# Patient Record
Sex: Female | Born: 1970 | Race: White | Hispanic: No | Marital: Married | Smoking: Never smoker
Health system: Southern US, Community
[De-identification: ages and names within clinical notes are randomized; demographics above are authoritative.]

## PROBLEM LIST (undated history)

## (undated) DIAGNOSIS — R748 Abnormal levels of other serum enzymes: Secondary | ICD-10-CM

## (undated) HISTORY — PX: CHOLECYSTECTOMY: SHX55

## (undated) HISTORY — PX: TUBAL LIGATION: SHX77

---

## 1998-10-20 HISTORY — PX: BREAST BIOPSY: SHX20

## 2007-05-06 ENCOUNTER — Ambulatory Visit: Payer: Self-pay

## 2007-06-06 ENCOUNTER — Emergency Department: Payer: Self-pay | Admitting: Emergency Medicine

## 2008-05-31 ENCOUNTER — Ambulatory Visit: Payer: Self-pay

## 2009-04-04 ENCOUNTER — Inpatient Hospital Stay: Payer: Self-pay | Admitting: Surgery

## 2009-04-10 ENCOUNTER — Ambulatory Visit: Payer: Self-pay | Admitting: Surgery

## 2009-06-05 ENCOUNTER — Ambulatory Visit: Payer: Self-pay

## 2010-06-19 ENCOUNTER — Ambulatory Visit: Payer: Self-pay

## 2011-11-26 ENCOUNTER — Ambulatory Visit: Payer: Self-pay

## 2012-11-30 ENCOUNTER — Ambulatory Visit: Payer: Self-pay

## 2014-01-02 ENCOUNTER — Ambulatory Visit: Payer: Self-pay

## 2015-01-04 ENCOUNTER — Ambulatory Visit: Payer: Self-pay

## 2015-01-17 ENCOUNTER — Ambulatory Visit
Admit: 2015-01-17 | Disposition: A | Discharge status: Home / Self Care | Payer: Self-pay | Attending: Certified Nurse Midwife | Admitting: Certified Nurse Midwife

## 2016-01-04 ENCOUNTER — Other Ambulatory Visit: Payer: Self-pay | Admitting: Gastroenterology

## 2016-01-04 DIAGNOSIS — R7989 Other specified abnormal findings of blood chemistry: Secondary | ICD-10-CM

## 2016-01-04 DIAGNOSIS — R945 Abnormal results of liver function studies: Secondary | ICD-10-CM

## 2016-01-08 ENCOUNTER — Other Ambulatory Visit: Payer: Self-pay | Admitting: Gastroenterology

## 2016-01-08 DIAGNOSIS — R7989 Other specified abnormal findings of blood chemistry: Secondary | ICD-10-CM

## 2016-01-08 DIAGNOSIS — R945 Abnormal results of liver function studies: Principal | ICD-10-CM

## 2016-01-28 ENCOUNTER — Ambulatory Visit
Admission: RE | Admit: 2016-01-28 | Discharge: 2016-01-28 | Disposition: A | Discharge status: Home / Self Care | Payer: BC Managed Care – PPO | Source: Ambulatory Visit | Attending: Gastroenterology | Admitting: Gastroenterology

## 2016-01-28 DIAGNOSIS — Z9049 Acquired absence of other specified parts of digestive tract: Secondary | ICD-10-CM | POA: Insufficient documentation

## 2016-01-28 DIAGNOSIS — R7989 Other specified abnormal findings of blood chemistry: Secondary | ICD-10-CM

## 2016-01-28 DIAGNOSIS — R945 Abnormal results of liver function studies: Secondary | ICD-10-CM

## 2016-01-28 DIAGNOSIS — K769 Liver disease, unspecified: Secondary | ICD-10-CM | POA: Diagnosis not present

## 2016-01-29 ENCOUNTER — Ambulatory Visit
Admission: RE | Admit: 2016-01-29 | Discharge: 2016-01-29 | Disposition: A | Discharge status: Home / Self Care | Payer: BC Managed Care – PPO | Source: Ambulatory Visit | Attending: Gastroenterology | Admitting: Gastroenterology

## 2016-01-29 DIAGNOSIS — R7989 Other specified abnormal findings of blood chemistry: Secondary | ICD-10-CM

## 2016-01-29 DIAGNOSIS — Z9049 Acquired absence of other specified parts of digestive tract: Secondary | ICD-10-CM | POA: Insufficient documentation

## 2016-01-29 DIAGNOSIS — R200 Anesthesia of skin: Secondary | ICD-10-CM | POA: Insufficient documentation

## 2016-01-29 DIAGNOSIS — R945 Abnormal results of liver function studies: Secondary | ICD-10-CM

## 2016-01-29 DIAGNOSIS — Z79899 Other long term (current) drug therapy: Secondary | ICD-10-CM | POA: Diagnosis not present

## 2016-01-29 DIAGNOSIS — R51 Headache: Secondary | ICD-10-CM | POA: Insufficient documentation

## 2016-01-29 DIAGNOSIS — R748 Abnormal levels of other serum enzymes: Secondary | ICD-10-CM | POA: Diagnosis not present

## 2016-01-29 HISTORY — DX: Abnormal levels of other serum enzymes: R74.8

## 2016-01-29 LAB — CBC
HCT: 43.3 % (ref 35.0–47.0)
HEMOGLOBIN: 14.7 g/dL (ref 12.0–16.0)
MCH: 30.3 pg (ref 26.0–34.0)
MCHC: 34 g/dL (ref 32.0–36.0)
MCV: 89 fL (ref 80.0–100.0)
Platelets: 312 10*3/uL (ref 150–440)
RBC: 4.86 MIL/uL (ref 3.80–5.20)
RDW: 13.4 % (ref 11.5–14.5)
WBC: 10.5 10*3/uL (ref 3.6–11.0)

## 2016-01-29 LAB — APTT: aPTT: 28 seconds (ref 24–36)

## 2016-01-29 LAB — PROTIME-INR
INR: 0.93
PROTHROMBIN TIME: 12.7 s (ref 11.4–15.0)

## 2016-01-29 MED ORDER — SODIUM CHLORIDE 0.9 % IV SOLN
INTRAVENOUS | Status: DC
  Administered 2016-01-29 (×2): via INTRAVENOUS

## 2016-01-29 MED ORDER — OXYCODONE HCL 5 MG PO TABS
5.0000 mg | ORAL_TABLET | ORAL | Status: DC | PRN
  Filled 2016-01-29: qty 1

## 2016-01-29 MED ORDER — FENTANYL CITRATE (PF) 100 MCG/2ML IJ SOLN
INTRAMUSCULAR | Status: AC | PRN
  Administered 2016-01-29: 25 ug via INTRAVENOUS
  Administered 2016-01-29: 50 ug via INTRAVENOUS

## 2016-01-29 MED ORDER — MIDAZOLAM HCL 2 MG/2ML IJ SOLN
INTRAMUSCULAR | Status: AC | PRN
  Administered 2016-01-29 (×2): 1 mg via INTRAVENOUS

## 2016-01-29 NOTE — Progress Notes (Signed)
Pt remains clinically stable post procedure, md in to speak with pt earlier with husband, dressing from bx c/d/i, denies complaints, ate lunch, ready for discharge.

## 2016-01-29 NOTE — H&P (Signed)
Chief Complaint: Patient was seen in consultation today for a liver biopsy at the request of Bridges,Janice  Referring Physician(s): Bridges,Janice    History of Present Illness: Tanya Graham is a 45 y.o. female with elevated liver enzymes.  Abnormal LFTs were first noticed at the end of last year.  Patient is asymptomatic.  Occasional sinus headaches and occasional numbness in finger tips and fingers transiently turn white.  Denies chest pain, SOB, bowel problems, GU problems or fevers.  Past Medical History  Diagnosis Date  . Elevated liver enzymes     Past Surgical History  Procedure Laterality Date  . Tubal ligation    . Cholecystectomy      Allergies: Review of patient's allergies indicates no known allergies.  Medications: Prior to Admission medications   Medication Sig Start Date End Date Taking? Authorizing Provider  norethindrone (MICRONOR,CAMILA,ERRIN) 0.35 MG tablet Take 1 tablet by mouth daily.   Yes Historical Provider, MD     History reviewed. No pertinent family history.  Social History   Social History  . Marital Status: Married    Spouse Name: N/A  . Number of Children: N/A  . Years of Education: N/A   Social History Main Topics  . Smoking status: Never Smoker   . Smokeless tobacco: None  . Alcohol Use: No  . Drug Use: No  . Sexual Activity: Yes   Other Topics Concern  . None   Social History Narrative  . None    ECOG Status: 0 - Asymptomatic    Review of Systems  Constitutional: Negative for fever and chills.  Respiratory: Negative.   Cardiovascular: Negative.   Gastrointestinal: Negative.   Genitourinary: Negative.   Neurological: Positive for numbness and headaches.    Vital Signs: BP 129/95 mmHg  Temp(Src) 98.6 F (37 C)  Resp 16  Ht 5\' 2"  (1.575 m)  Wt 125 lb (56.7 kg)  BMI 22.86 kg/m2  LMP 01/28/2014  Physical Exam  Constitutional: She is oriented to person, place, and time.  Cardiovascular: Normal  rate, regular rhythm, normal heart sounds and intact distal pulses.   Pulmonary/Chest: Effort normal and breath sounds normal.  Abdominal: Soft. Bowel sounds are normal. She exhibits no distension. There is no tenderness.  Neurological: She is alert and oriented to person, place, and time.    Mallampati Score:  MD Evaluation Airway: WNL Heart: WNL Abdomen: WNL Chest/ Lungs: WNL ASA  Classification: 1 Mallampati/Airway Score: One  Imaging: [place] Abdomen Limited Ruq  01/28/2016  CLINICAL DATA:  Cholecystectomy. EXAM: US ABDOMEN LIMITED - RIGHT UPPER QUADRANT COMPARISON:  CT 06/06/2007. FINDINGS: Gallbladder: Cholecystectomy.  No pericholecystic fluid collection. Common bile duct: Diameter: 4.2 mm. Liver: Liver is echodense consistent fatty infiltration and/or hepatocellular disease. No focal hepatic abnormality identified. IMPRESSION: 1. Cholecystectomy.  No biliary distention. 2. Liver is echodense consistent with fatty infiltration and/or hepatocellular disease. Electronically Signed   By: Maisie Fushomas  Register   On: 01/28/2016 08:52    Labs:  CBC: No results for input(s): WBC, HGB, HCT, PLT in the last 8760 hours.  COAGS:  Recent Labs  01/29/16 0848  INR 0.93  APTT 28    BMP: No results for input(s): NA, K, CL, CO2, GLUCOSE, BUN, CALCIUM, CREATININE, GFRNONAA, GFRAA in the last 8760 hours.  Invalid input(s): CMP  LIVER FUNCTION TESTS: No results for input(s): BILITOT, AST, ALT, ALKPHOS, PROT, ALBUMIN in the last 8760 hours.  TUMOR MARKERS: No results for input(s): AFPTM, CEA, CA199, CHROMGRNA in the last 8760  hours.  Assessment and Plan:  45 yo with elevated liver enzymes and presents for an ultrasound guided liver biopsy.  The risks of the procedure, including bleeding and infection, were discussed with patient.  Informed consent obtained.  Plan for ultrasound guided random liver biopsy with moderate sedation.     Electronically Signed: Abundio Miu 01/29/2016, 9:22  AM

## 2016-01-29 NOTE — Discharge Instructions (Signed)
Needle Biopsy, Care After °Refer to this sheet in the next few weeks. These instructions provide you with information about caring for yourself after your procedure. Your health care provider may also give you more specific instructions. Your treatment has been planned according to current medical practices, but problems sometimes occur. Call your health care provider if you have any problems or questions after your procedure. °WHAT TO EXPECT AFTER THE PROCEDURE °After your procedure, it is common to have soreness, bruising, or mild pain at the biopsy site. This should go away in a few days. °HOME CARE INSTRUCTIONS °· Rest as directed by your health care provider. °· Take medicines only as directed by your health care provider. °· There are many different ways to close and cover the biopsy site, including stitches (sutures), skin glue, and adhesive strips. Follow your health care provider's instructions about: °¨ Biopsy site care. °¨ Bandage (dressing) changes and removal. °¨ Biopsy site closure removal. °· Check your biopsy site every day for signs of infection. Watch for: °¨ Redness, swelling, or pain. °¨ Fluid, blood, or pus. °SEEK MEDICAL CARE IF: °· You have a fever. °· You have redness, swelling, or pain at the biopsy site that lasts longer than a few days. °· You have fluid, blood, or pus coming from the biopsy site. °· You feel nauseous. °· You vomit. °SEEK IMMEDIATE MEDICAL CARE IF: °· You have shortness of breath. °· You have trouble breathing. °· You have chest pain.   °· You feel dizzy or you faint. °· You have bleeding that does not stop with pressure or a bandage. °· You cough up blood. °· You have pain in your abdomen. °  °This information is not intended to replace advice given to you by your health care provider. Make sure you discuss any questions you have with your health care provider. °  °Document Released: 02/20/2015 Document Reviewed: 02/20/2015 °Elsevier Interactive Patient Education ©2016  [employer] ° °

## 2016-01-29 NOTE — Procedures (Signed)
US guided random liver biopsy.  3 cores obtained from right hepatic lobe.  Minimal bleeding.  No immediate complication.  Plan for 3 hours bedrest.

## 2016-01-30 LAB — SURGICAL PATHOLOGY

## 2016-08-25 ENCOUNTER — Other Ambulatory Visit: Payer: Self-pay | Admitting: Obstetrics and Gynecology

## 2016-08-25 DIAGNOSIS — Z1231 Encounter for screening mammogram for malignant neoplasm of breast: Secondary | ICD-10-CM

## 2016-09-04 ENCOUNTER — Ambulatory Visit
Admission: RE | Admit: 2016-09-04 | Discharge: 2016-09-04 | Disposition: A | Discharge status: Home / Self Care | Payer: BC Managed Care – PPO | Source: Ambulatory Visit | Attending: Obstetrics and Gynecology | Admitting: Obstetrics and Gynecology

## 2016-09-04 DIAGNOSIS — Z1231 Encounter for screening mammogram for malignant neoplasm of breast: Secondary | ICD-10-CM | POA: Diagnosis not present

## 2016-09-10 ENCOUNTER — Other Ambulatory Visit: Payer: Self-pay | Admitting: Obstetrics and Gynecology

## 2016-09-10 DIAGNOSIS — N6489 Other specified disorders of breast: Secondary | ICD-10-CM

## 2016-09-30 ENCOUNTER — Ambulatory Visit
Admission: RE | Admit: 2016-09-30 | Discharge: 2016-09-30 | Disposition: A | Discharge status: Home / Self Care | Payer: BC Managed Care – PPO | Source: Ambulatory Visit | Attending: Obstetrics and Gynecology | Admitting: Obstetrics and Gynecology

## 2016-09-30 DIAGNOSIS — N6489 Other specified disorders of breast: Secondary | ICD-10-CM | POA: Diagnosis present

## 2017-09-14 ENCOUNTER — Other Ambulatory Visit: Payer: Self-pay | Admitting: Medical Oncology

## 2017-09-14 DIAGNOSIS — Z1231 Encounter for screening mammogram for malignant neoplasm of breast: Secondary | ICD-10-CM

## 2017-10-15 ENCOUNTER — Encounter (INDEPENDENT_AMBULATORY_CARE_PROVIDER_SITE_OTHER): Payer: Self-pay

## 2017-10-15 ENCOUNTER — Ambulatory Visit
Admission: RE | Admit: 2017-10-15 | Discharge: 2017-10-15 | Disposition: A | Discharge status: Home / Self Care | Payer: BC Managed Care – PPO | Source: Ambulatory Visit | Attending: Medical Oncology | Admitting: Medical Oncology

## 2017-10-15 DIAGNOSIS — Z1231 Encounter for screening mammogram for malignant neoplasm of breast: Secondary | ICD-10-CM | POA: Insufficient documentation

## 2018-10-21 ENCOUNTER — Other Ambulatory Visit: Payer: Self-pay | Admitting: Obstetrics and Gynecology

## 2018-10-21 DIAGNOSIS — Z1231 Encounter for screening mammogram for malignant neoplasm of breast: Secondary | ICD-10-CM

## 2018-11-10 ENCOUNTER — Ambulatory Visit
Admission: RE | Admit: 2018-11-10 | Discharge: 2018-11-10 | Disposition: A | Discharge status: Home / Self Care | Payer: BC Managed Care – PPO | Source: Ambulatory Visit | Attending: Obstetrics and Gynecology | Admitting: Obstetrics and Gynecology

## 2018-11-10 DIAGNOSIS — Z1231 Encounter for screening mammogram for malignant neoplasm of breast: Secondary | ICD-10-CM | POA: Diagnosis present

## 2018-11-16 ENCOUNTER — Other Ambulatory Visit: Payer: Self-pay | Admitting: Obstetrics and Gynecology

## 2018-11-16 DIAGNOSIS — N6489 Other specified disorders of breast: Secondary | ICD-10-CM

## 2018-11-16 DIAGNOSIS — R928 Other abnormal and inconclusive findings on diagnostic imaging of breast: Secondary | ICD-10-CM

## 2018-11-19 ENCOUNTER — Ambulatory Visit
Admission: RE | Admit: 2018-11-19 | Discharge: 2018-11-19 | Disposition: A | Discharge status: Home / Self Care | Payer: BC Managed Care – PPO | Source: Ambulatory Visit | Attending: Obstetrics and Gynecology | Admitting: Obstetrics and Gynecology

## 2018-11-19 DIAGNOSIS — R928 Other abnormal and inconclusive findings on diagnostic imaging of breast: Secondary | ICD-10-CM

## 2018-11-19 DIAGNOSIS — N6489 Other specified disorders of breast: Secondary | ICD-10-CM

## 2019-04-04 ENCOUNTER — Other Ambulatory Visit: Payer: Self-pay | Admitting: Obstetrics and Gynecology

## 2019-04-04 DIAGNOSIS — N6320 Unspecified lump in the left breast, unspecified quadrant: Secondary | ICD-10-CM

## 2019-05-23 ENCOUNTER — Ambulatory Visit
Admission: RE | Admit: 2019-05-23 | Discharge: 2019-05-23 | Disposition: A | Discharge status: Home / Self Care | Payer: BC Managed Care – PPO | Source: Ambulatory Visit | Attending: Obstetrics and Gynecology | Admitting: Obstetrics and Gynecology

## 2019-05-23 DIAGNOSIS — N6320 Unspecified lump in the left breast, unspecified quadrant: Secondary | ICD-10-CM

## 2020-01-18 ENCOUNTER — Other Ambulatory Visit: Payer: Self-pay | Admitting: Obstetrics and Gynecology

## 2020-01-18 DIAGNOSIS — N6489 Other specified disorders of breast: Secondary | ICD-10-CM

## 2020-02-01 ENCOUNTER — Ambulatory Visit
Admission: RE | Admit: 2020-02-01 | Discharge: 2020-02-01 | Disposition: A | Discharge status: Home / Self Care | Payer: BC Managed Care – PPO | Source: Ambulatory Visit | Attending: Obstetrics and Gynecology | Admitting: Obstetrics and Gynecology

## 2020-02-01 ENCOUNTER — Other Ambulatory Visit: Payer: BC Managed Care – PPO

## 2020-02-01 DIAGNOSIS — N6489 Other specified disorders of breast: Secondary | ICD-10-CM | POA: Insufficient documentation

## 2021-02-06 ENCOUNTER — Other Ambulatory Visit: Payer: Self-pay | Admitting: Obstetrics and Gynecology

## 2021-02-06 DIAGNOSIS — N6489 Other specified disorders of breast: Secondary | ICD-10-CM

## 2021-03-28 ENCOUNTER — Other Ambulatory Visit: Payer: Self-pay

## 2021-03-28 ENCOUNTER — Ambulatory Visit
Admission: RE | Admit: 2021-03-28 | Discharge: 2021-03-28 | Disposition: A | Discharge status: Home / Self Care | Payer: BC Managed Care – PPO | Source: Ambulatory Visit | Attending: Obstetrics and Gynecology | Admitting: Obstetrics and Gynecology

## 2021-03-28 DIAGNOSIS — N6489 Other specified disorders of breast: Secondary | ICD-10-CM | POA: Insufficient documentation

## 2021-11-23 IMAGING — MG DIGITAL DIAGNOSTIC BILAT W/ TOMO W/ CAD
8 series · 9 of 24 positions shown · non-contrast
Comparison: Previous exam(s).

CLINICAL DATA: BI-RADS 3 follow-up of a LEFT breast asymmetry.
Initiated October 2018.

EXAM:
DIGITAL DIAGNOSTIC BILATERAL MAMMOGRAM WITH TOMOSYNTHESIS AND CAD
TECHNIQUE: Bilateral digital diagnostic mammography and breast tomosynthesis
was performed. The images were evaluated with computer-aided
detection.

[L CC synth-2D]
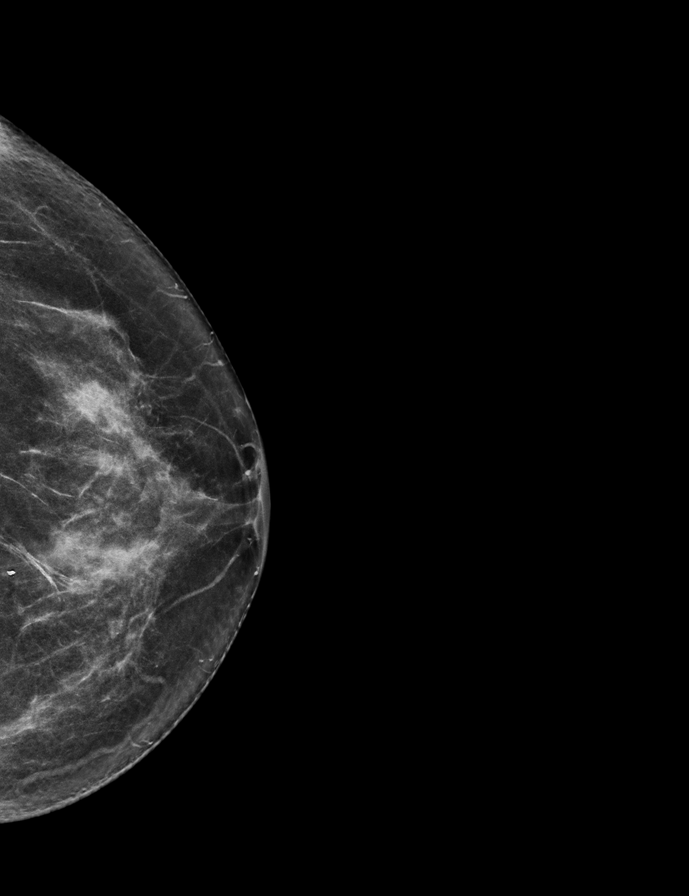

[L MLO synth-2D]
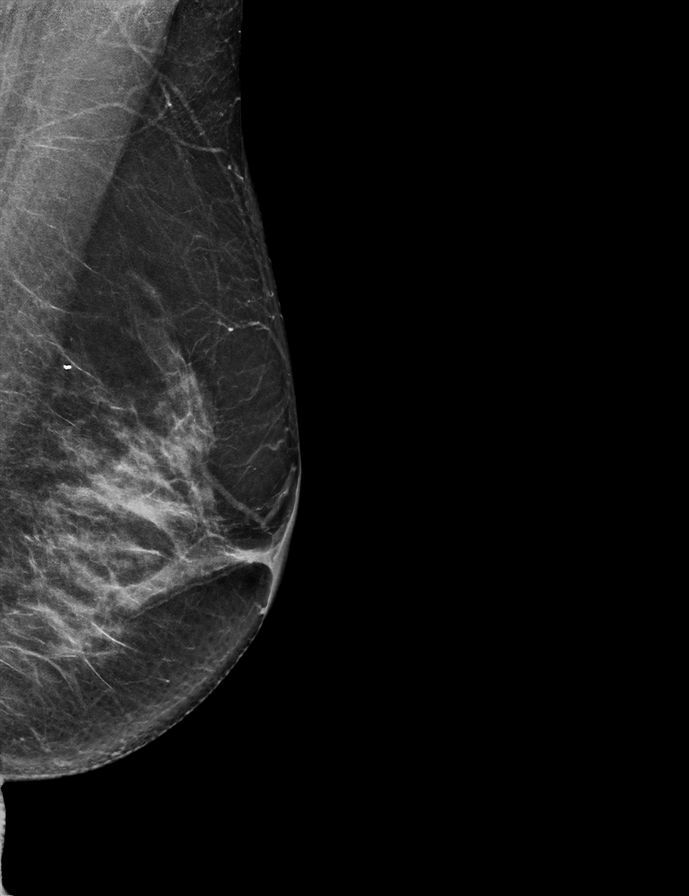

[R CC synth-2D]
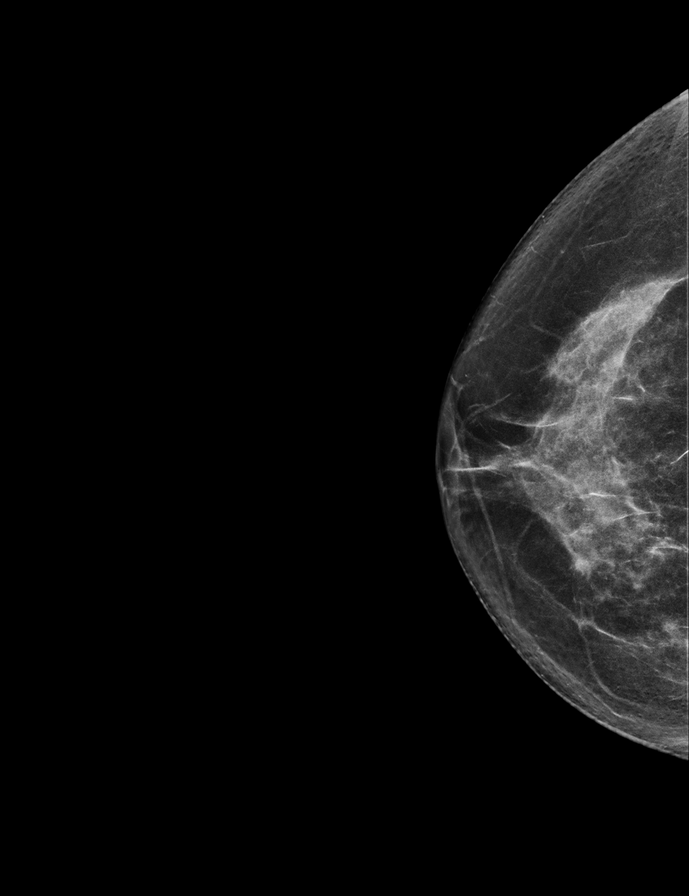

[R MLO synth-2D]
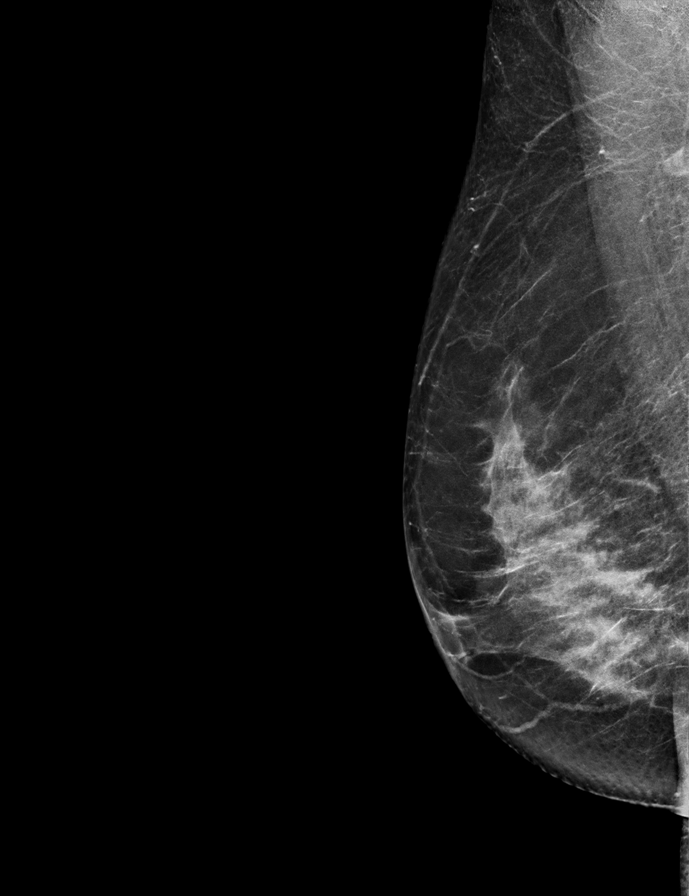

[R MLO tomo · 2 of 68 frames shown]
[frame 22/68]
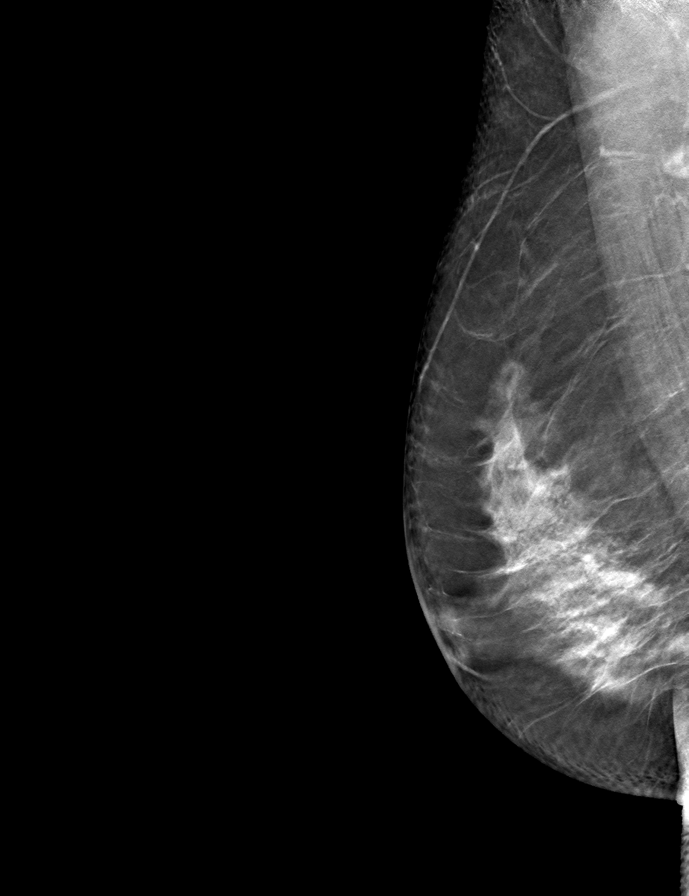
[frame 35/68]
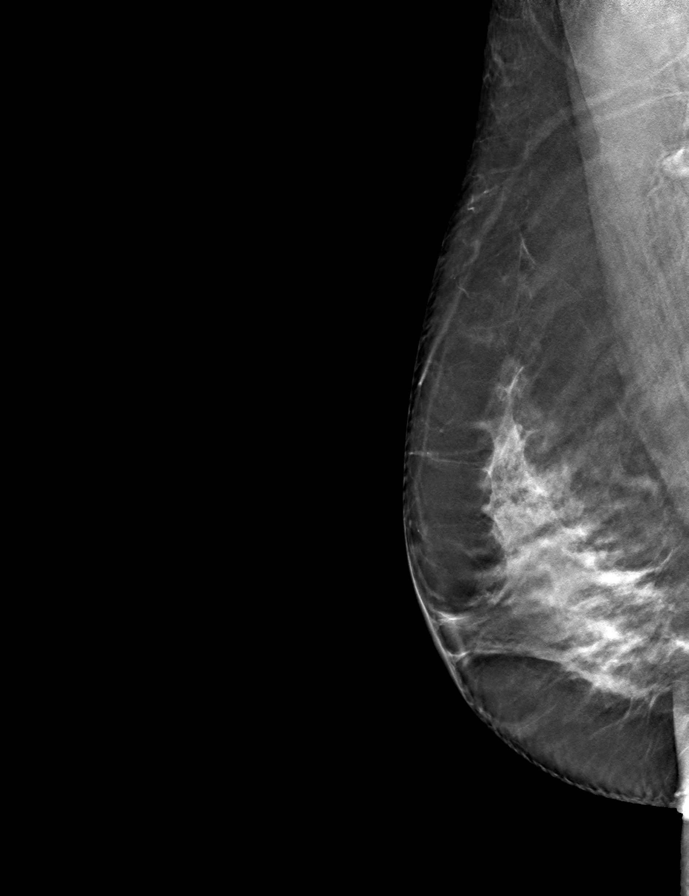

[L CC tomo · tomo slice 33/65.0]
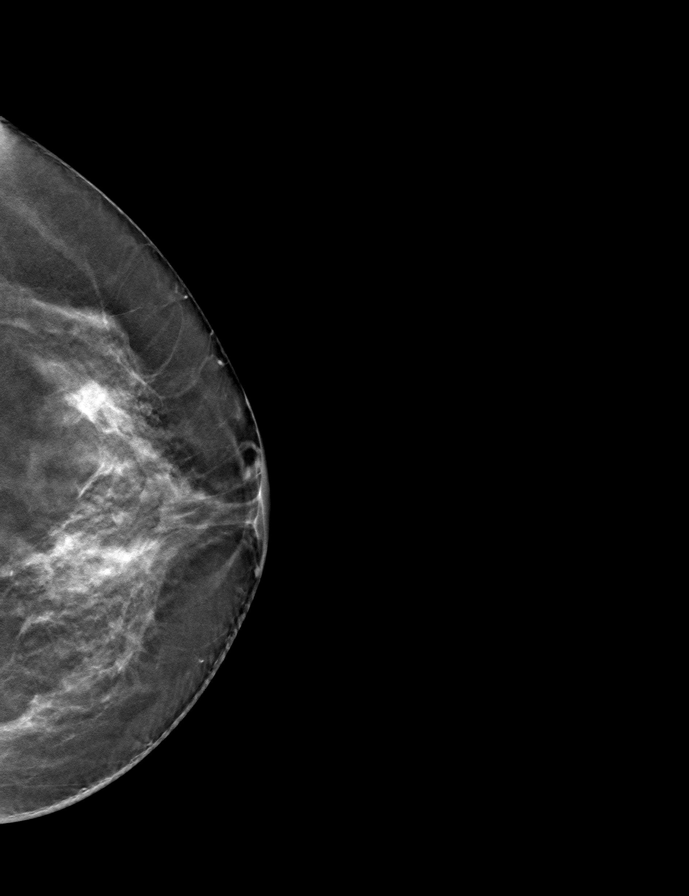

[L MLO tomo · tomo slice 35/69.0]
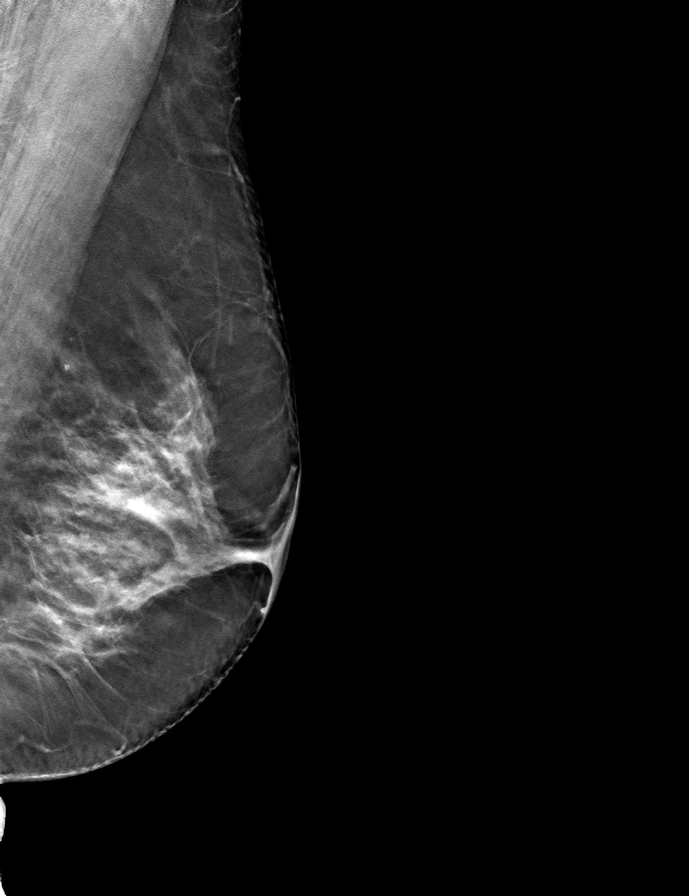

[R CC tomo · tomo slice 33/66.0]
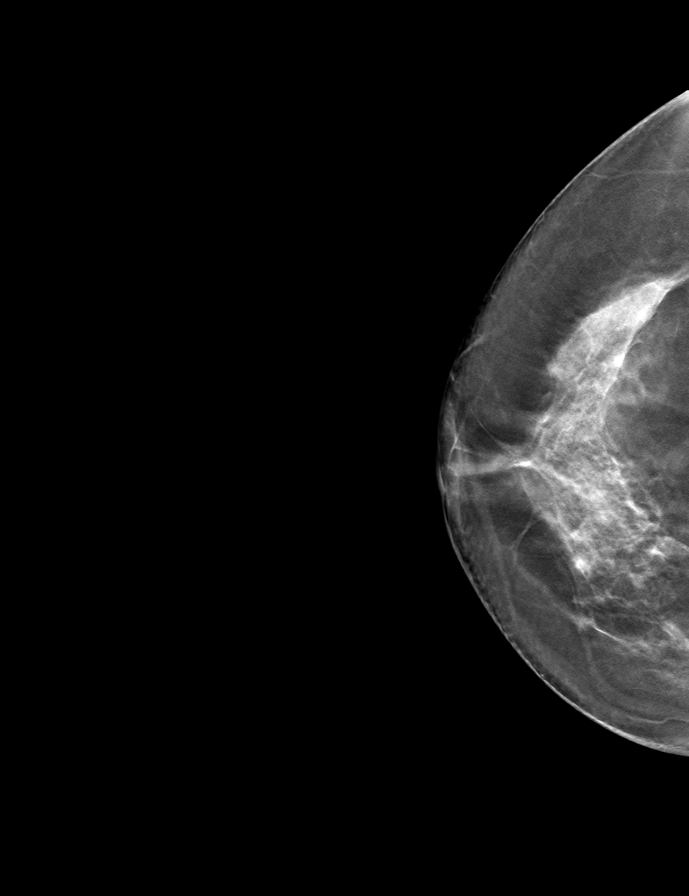

[9 of 24 positions shown; findings below may reference images not displayed]

ACR Breast Density Category c: The breast tissue is heterogeneously
dense, which may obscure small masses.
FINDINGS: Diagnostic images demonstrate mammographic stability of a LEFT
breast asymmetry seen on CC view in the outer breast at middle to
posterior depth. This is unchanged in comparison to prior. No
suspicious mass, distortion, or microcalcifications are identified
to suggest presence of malignancy.
IMPRESSION: No mammographic evidence of malignancy. Stable LEFT breast asymmetry
for greater than 2 years, consistent with benign etiology.

RECOMMENDATION:
Screening mammogram in one year.(Code:[id number]-[id number])

I have discussed the findings and recommendations with the patient.
If applicable, a reminder letter will be sent to the patient
regarding the next appointment.

BI-RADS CATEGORY  2: Benign.

## 2022-02-24 ENCOUNTER — Other Ambulatory Visit: Payer: Self-pay | Admitting: Obstetrics and Gynecology

## 2022-02-24 DIAGNOSIS — Z1231 Encounter for screening mammogram for malignant neoplasm of breast: Secondary | ICD-10-CM

## 2022-04-01 ENCOUNTER — Ambulatory Visit
Admission: RE | Admit: 2022-04-01 | Discharge: 2022-04-01 | Disposition: A | Discharge status: Home / Self Care | Payer: BC Managed Care – PPO | Source: Ambulatory Visit | Attending: Obstetrics and Gynecology | Admitting: Obstetrics and Gynecology

## 2022-04-01 DIAGNOSIS — Z1231 Encounter for screening mammogram for malignant neoplasm of breast: Secondary | ICD-10-CM | POA: Insufficient documentation

## 2022-08-29 ENCOUNTER — Other Ambulatory Visit: Payer: Self-pay

## 2022-08-29 DIAGNOSIS — K760 Fatty (change of) liver, not elsewhere classified: Secondary | ICD-10-CM

## 2022-08-29 DIAGNOSIS — K7581 Nonalcoholic steatohepatitis (NASH): Secondary | ICD-10-CM

## 2022-09-08 ENCOUNTER — Other Ambulatory Visit: Payer: Self-pay | Admitting: Internal Medicine

## 2022-09-08 DIAGNOSIS — K760 Fatty (change of) liver, not elsewhere classified: Secondary | ICD-10-CM

## 2022-09-08 DIAGNOSIS — K7581 Nonalcoholic steatohepatitis (NASH): Secondary | ICD-10-CM

## 2022-09-18 ENCOUNTER — Ambulatory Visit
Admission: RE | Admit: 2022-09-18 | Discharge: 2022-09-18 | Disposition: A | Discharge status: Home / Self Care | Payer: BC Managed Care – PPO | Source: Ambulatory Visit | Attending: Internal Medicine | Admitting: Internal Medicine

## 2022-09-18 DIAGNOSIS — K7581 Nonalcoholic steatohepatitis (NASH): Secondary | ICD-10-CM

## 2022-09-18 DIAGNOSIS — K760 Fatty (change of) liver, not elsewhere classified: Secondary | ICD-10-CM

## 2023-01-21 ENCOUNTER — Ambulatory Visit: Payer: BC Managed Care – PPO

## 2023-01-21 DIAGNOSIS — K635 Polyp of colon: Secondary | ICD-10-CM | POA: Diagnosis not present

## 2023-01-21 DIAGNOSIS — Z1211 Encounter for screening for malignant neoplasm of colon: Secondary | ICD-10-CM | POA: Diagnosis present

## 2023-01-21 DIAGNOSIS — K621 Rectal polyp: Secondary | ICD-10-CM | POA: Diagnosis not present

## 2023-02-26 ENCOUNTER — Other Ambulatory Visit: Payer: Self-pay | Admitting: Obstetrics and Gynecology

## 2023-02-26 DIAGNOSIS — Z1231 Encounter for screening mammogram for malignant neoplasm of breast: Secondary | ICD-10-CM

## 2023-04-10 ENCOUNTER — Ambulatory Visit
Admission: RE | Admit: 2023-04-10 | Discharge: 2023-04-10 | Disposition: A | Discharge status: Home / Self Care | Payer: BC Managed Care – PPO | Source: Ambulatory Visit | Attending: Obstetrics and Gynecology | Admitting: Obstetrics and Gynecology

## 2023-04-10 DIAGNOSIS — Z1231 Encounter for screening mammogram for malignant neoplasm of breast: Secondary | ICD-10-CM | POA: Diagnosis not present

## 2024-03-04 ENCOUNTER — Other Ambulatory Visit: Payer: Self-pay | Admitting: Internal Medicine

## 2024-03-04 DIAGNOSIS — Z1231 Encounter for screening mammogram for malignant neoplasm of breast: Secondary | ICD-10-CM

## 2024-04-11 ENCOUNTER — Encounter: Payer: Self-pay | Admitting: Radiology

## 2024-04-11 ENCOUNTER — Ambulatory Visit
Admission: RE | Admit: 2024-04-11 | Discharge: 2024-04-11 | Disposition: A | Discharge status: Home / Self Care | Payer: Self-pay | Source: Ambulatory Visit | Attending: Internal Medicine | Admitting: Internal Medicine

## 2024-04-11 DIAGNOSIS — Z1231 Encounter for screening mammogram for malignant neoplasm of breast: Secondary | ICD-10-CM | POA: Diagnosis present
